# Patient Record
Sex: Male | Born: 1990 | Race: Black or African American | Hispanic: No | Marital: Single | Smoking: Never smoker
Health system: Southern US, Community
[De-identification: ages and names within clinical notes are randomized; demographics above are authoritative.]

## PROBLEM LIST (undated history)

## (undated) DIAGNOSIS — R569 Unspecified convulsions: Secondary | ICD-10-CM

## (undated) HISTORY — DX: Unspecified convulsions: R56.9

---
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
## 2012-03-31 ENCOUNTER — Ambulatory Visit: Payer: BC Managed Care – PPO

## 2012-03-31 ENCOUNTER — Telehealth: Payer: Self-pay | Admitting: Internal Medicine

## 2012-03-31 DIAGNOSIS — Z2839 Other underimmunization status: Secondary | ICD-10-CM | POA: Insufficient documentation

## 2012-03-31 DIAGNOSIS — Z283 Underimmunization status: Secondary | ICD-10-CM | POA: Insufficient documentation

## 2012-03-31 DIAGNOSIS — A64 Unspecified sexually transmitted disease: Secondary | ICD-10-CM

## 2012-03-31 DIAGNOSIS — Z0000 Encounter for general adult medical examination without abnormal findings: Secondary | ICD-10-CM

## 2012-05-19 ENCOUNTER — Ambulatory Visit: Payer: BC Managed Care – PPO | Admitting: Internal Medicine

## 2012-05-20 ENCOUNTER — Encounter: Payer: Self-pay | Admitting: Internal Medicine

## 2012-05-20 ENCOUNTER — Ambulatory Visit (INDEPENDENT_AMBULATORY_CARE_PROVIDER_SITE_OTHER): Payer: BC Managed Care – PPO | Admitting: Internal Medicine

## 2012-05-20 VITALS — BP 112/70 | HR 56 | Temp 97.0°F | Ht 76.0 in | Wt 182.2 lb

## 2012-05-20 DIAGNOSIS — Z0000 Encounter for general adult medical examination without abnormal findings: Secondary | ICD-10-CM

## 2012-05-20 DIAGNOSIS — Z23 Encounter for immunization: Secondary | ICD-10-CM

## 2012-05-20 DIAGNOSIS — Z283 Underimmunization status: Secondary | ICD-10-CM

## 2012-05-20 NOTE — Assessment & Plan Note (Signed)
Due for MMR and meningococcal today - to be done, note given

## 2012-05-20 NOTE — Patient Instructions (Addendum)
You had the MMR and Meningococcal shots today, and the note for this given today Please continue your efforts at being more active, low cholesterol diet, and weight control. Daisey Must with your year at [employer]!

## 2012-05-21 ENCOUNTER — Encounter: Payer: Self-pay | Admitting: Internal Medicine

## 2012-05-21 NOTE — Progress Notes (Signed)
Subjective:    Patient ID: Andrew Barry, male    DOB: 04-29-1991, 21 y.o.   MRN: [id number]  HPI  Here for wellness and f/u with mother;  Overall doing ok;  Pt denies CP, worsening SOB, DOE, wheezing, orthopnea, PND, worsening LE edema, palpitations, dizziness or syncope.  Pt denies neurological change such as new Headache, facial or extremity weakness.  Pt denies polydipsia, polyuria, or low sugar symptoms. Pt states overall good compliance with treatment and medications, good tolerability, and trying to follow lower cholesterol diet.  Pt denies worsening depressive symptoms, suicidal ideation or panic. No fever, wt loss, night sweats, loss of appetite, or other constitutional symptoms.  Pt states good ability with ADL's, low fall risk, home safety reviewed and adequate, no significant changes in hearing or vision, and occasionally active with exercise.  Pt denies fever, wt loss, night sweats, loss of appetite, or other constitutional symptoms  Might participate in intramural sports in college - for first year [employer] start tomorrow.  Needs MMR and meningococcal immunizations today Past Medical History  Diagnosis Date  . 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  reports that he has never smoked. He does not have any smokeless tobacco history on file. He reports that he does not drink alcohol or use illicit drugs. family history includes Dementia in his maternal grandmother and Stroke in his maternal grandmother. No Known Allergies No current outpatient prescriptions on file prior to visit.   Current Facility-Administered Medications on File Prior to Visit  Medication Dose Route Frequency Provider Last Rate Last Dose  . TDaP (BOOSTRIX) injection 0.5 mL  0.5 mL Intramuscular Once Corwin Levins, MD       Review of Systems Review of Systems  Constitutional: Negative for diaphoresis, activity change, appetite  change and unexpected weight change.  HENT: Negative for hearing loss, ear pain, facial swelling, mouth sores and neck stiffness.   Eyes: Negative for pain, redness and visual disturbance.  Respiratory: Negative for shortness of breath and wheezing.   Cardiovascular: Negative for chest pain and palpitations.  Gastrointestinal: Negative for diarrhea, blood in stool, abdominal distention and rectal pain.  Genitourinary: Negative for hematuria, flank pain and decreased urine volume.  Musculoskeletal: Negative for myalgias and joint swelling.  Skin: Negative for color change and wound.  Neurological: Negative for syncope and numbness.  Hematological: Negative for adenopathy.  Psychiatric/Behavioral: Negative for hallucinations, self-injury, decreased concentration and agitation.      Objective:   Physical Exam BP 112/70  Pulse 56  Temp 97 F (36.1 C) (Oral)  Ht 6\' 4"  (1.93 m)  Wt 182 lb 4 oz (82.668 kg)  BMI 22.18 kg/m2  SpO2 98% Physical Exam  VS noted Constitutional: Pt is oriented to person, place, and time. Appears well-developed and well-nourished.  HENT:  Head: Normocephalic and atraumatic.  Right Ear: External ear normal.  Left Ear: External ear normal.  Nose: Nose normal.  Mouth/Throat: Oropharynx is clear and moist.  Eyes: Conjunctivae and EOM are normal. Pupils are equal, round, and reactive to light.  Neck: Normal range of motion. Neck supple. No JVD present. No tracheal deviation present.  Cardiovascular: Normal rate, regular rhythm, normal heart sounds and intact distal pulses.   Pulmonary/Chest: Effort normal and breath sounds normal.  Abdominal: Soft. Bowel sounds are normal. There is no tenderness.  Musculoskeletal: Normal range of motion. Exhibits no edema.  Lymphadenopathy:  Has no cervical adenopathy.  Neurological: Pt is alert and oriented to person, place, and time. Pt has normal reflexes. No cranial nerve deficit.  Skin: Skin is warm and dry. No rash noted.    Psychiatric:  Has  normal mood and affect. Behavior is normal.     Assessment & Plan:

## 2012-05-21 NOTE — Assessment & Plan Note (Signed)

## 2012-08-24 ENCOUNTER — Ambulatory Visit: Payer: Self-pay | Admitting: Internal Medicine

## 2012-08-24 DIAGNOSIS — Z0289 Encounter for other administrative examinations: Secondary | ICD-10-CM

## 2013-05-28 ENCOUNTER — Emergency Department (HOSPITAL_COMMUNITY): Payer: BC Managed Care – PPO

## 2013-05-28 ENCOUNTER — Emergency Department (HOSPITAL_COMMUNITY)
Admission: EM | Admit: 2013-05-28 | Discharge: 2013-05-28 | Disposition: A | Discharge status: Home / Self Care | Payer: BC Managed Care – PPO | Attending: Emergency Medicine | Admitting: Emergency Medicine

## 2013-05-28 ENCOUNTER — Encounter (HOSPITAL_COMMUNITY): Payer: Self-pay | Admitting: Emergency Medicine

## 2013-05-28 DIAGNOSIS — S8252XA Displaced fracture of medial malleolus of left tibia, initial encounter for closed fracture: Secondary | ICD-10-CM

## 2013-05-28 DIAGNOSIS — X500XXA Overexertion from strenuous movement or load, initial encounter: Secondary | ICD-10-CM | POA: Insufficient documentation

## 2013-05-28 DIAGNOSIS — R609 Edema, unspecified: Secondary | ICD-10-CM | POA: Insufficient documentation

## 2013-05-28 DIAGNOSIS — S8253XA Displaced fracture of medial malleolus of unspecified tibia, initial encounter for closed fracture: Secondary | ICD-10-CM | POA: Insufficient documentation

## 2013-05-28 DIAGNOSIS — Y9367 Activity, basketball: Secondary | ICD-10-CM | POA: Insufficient documentation

## 2013-05-28 DIAGNOSIS — Y929 Unspecified place or not applicable: Secondary | ICD-10-CM | POA: Insufficient documentation

## 2013-05-28 MED ORDER — HYDROCODONE-ACETAMINOPHEN 5-325 MG PO TABS: 2.0000 | ORAL_TABLET | Freq: Four times a day (QID) | ORAL | Status: AC | PRN

## 2013-05-28 MED ORDER — HYDROCODONE-ACETAMINOPHEN 5-325 MG PO TABS
2.0000 | ORAL_TABLET | Freq: Once | ORAL | Status: AC
  Administered 2013-05-28: 2 via ORAL
  Filled 2013-05-28: qty 2

## 2013-05-28 MED ORDER — PROMETHAZINE HCL 25 MG PO TABS: 25.0000 mg | ORAL_TABLET | Freq: Four times a day (QID) | ORAL | Status: AC | PRN

## 2013-05-28 MED ORDER — ONDANSETRON 8 MG PO TBDP
8.0000 mg | ORAL_TABLET | Freq: Once | ORAL | Status: AC
  Administered 2013-05-28: 8 mg via ORAL
  Filled 2013-05-28: qty 1

## 2013-05-28 NOTE — ED Provider Notes (Signed)
CSN: [id number]     Arrival date & time 05/28/13  1314 History     First MD Initiated Contact with Patient 05/28/13 1345     Chief Complaint  Patient presents with  . Ankle Injury   (Consider location/radiation/quality/duration/timing/severity/associated sxs/prior Treatment) HPI Comments: Patient is a 22 year old male presents today with Left ankle pain. He reports that he jumped up for a layup yesterday and came down on his left ankle. He had an eversion ankle injury at that time. Pain is sharp and now throbbing. Walking and palpation make the pain worse. He has been walking on this ankle since the time of the injury. He reports that he was going to ice and elevate his ankle last night, but did not because he fell asleep early. He has never injured this ankle in the past. No fever, chills, nausea, vomiting, abdominal pain, numbness, weakness, paresthesias.   The history is provided by the patient. No language interpreter was used.    Past Medical History  Diagnosis Date  . 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 Family History  Problem Relation Age of Onset  . Stroke Maternal Grandmother   . Dementia Maternal Grandmother    History  Substance Use Topics  . Smoking status: Never Smoker   . Smokeless tobacco: Not on file  . Alcohol Use: No    Review of Systems  Constitutional: Negative for fever and chills.  Respiratory: Negative for shortness of breath.   Gastrointestinal: Negative for nausea, vomiting and abdominal pain.  Musculoskeletal: Positive for joint swelling, arthralgias and gait problem.  All other systems reviewed and are negative.    Allergies  Review of patient's allergies indicates no known allergies.  Home Medications  No current outpatient prescriptions on file. BP 111/65  Pulse 61  Temp(Src) 97.6 F (36.4 C) (Oral)  Resp 18  Wt 195 lb (88.451 kg)  BMI  23.75 kg/m2  SpO2 100% Physical Exam  Nursing note and vitals reviewed. Constitutional: He is oriented to person, place, and time. He appears well-developed and well-nourished. No distress.  HENT:  Head: Normocephalic and atraumatic.  Right Ear: External ear normal.  Left Ear: External ear normal.  Nose: Nose normal.  Eyes: Conjunctivae are normal.  Neck: Normal range of motion. No tracheal deviation present.  Cardiovascular: Normal rate, regular rhythm and normal heart sounds.   Pulmonary/Chest: Effort normal and breath sounds normal. No stridor.  Abdominal: Soft. He exhibits no distension. There is no tenderness.  Musculoskeletal: Normal range of motion.  Neurovascularly intact. Compartment soft. Swelling surrounding the medial malleolus. Area is tender, but without bruising or erythema.  Neurological: He is alert and oriented to person, place, and time.  Skin: Skin is warm and dry. He is not diaphoretic.  Psychiatric: He has a normal mood and affect. His behavior is normal.    ED Course   Procedures (including critical care time)  Labs Reviewed - No data to display Dg Ankle Complete Left  05/28/2013   *RADIOLOGY REPORT*  Clinical Data: Pain after jumping injury.  LEFT ANKLE COMPLETE - 3+ VIEW  Comparison: None.  Findings: There is a small avulsion fracture from the tip of the medial malleolus, distracted less than a millimeter.  There is some adjacent soft tissue swelling.  Ankle mortise intact.  No other acute bony abnormality.  No significant osseous degenerative change.  Normal mineralization and alignment.  IMPRESSION:  Medial malleolus cortical avulsion fracture with associated soft tissue swelling.   Original Report Authenticated By: D. Andria Rhein, MD   1. Avulsion fracture of medial malleolus, left, closed, initial encounter     MDM  Patient with medial malleolus fracture. Neurovascularly intact. Compartment soft. He was given crutches and a Cam Walker. He was encouraged  to follow up with Ortho and remain nonweightbearing until he has done so. He was given a short course of pain medication. Rest, ice, elevate. Return instructions given. Vital signs stable for discharge. Patient / Family / Caregiver informed of clinical course, understand medical decision-making process, and agree with plan.   Mora Bellman, PA-C 05/28/13 [phone]

## 2013-05-28 NOTE — ED Notes (Signed)
Ortho tec at bedside 

## 2013-05-28 NOTE — ED Notes (Signed)
Pt states that he hurt his left ankle yesterday playing basketball.

## 2013-05-29 NOTE — ED Provider Notes (Signed)
Medical screening examination/treatment/procedure(s) were performed by non-physician practitioner and as supervising physician I was immediately available for consultation/collaboration.    Haidyn Chadderdon J. Farron Lafond, MD 05/29/13 0706 

## 2014-08-24 ENCOUNTER — Emergency Department (HOSPITAL_COMMUNITY)
Admission: EM | Admit: 2014-08-24 | Discharge: 2014-08-24 | Disposition: A | Discharge status: Home / Self Care | Payer: BC Managed Care – PPO | Attending: Emergency Medicine | Admitting: Emergency Medicine

## 2014-08-24 ENCOUNTER — Emergency Department (HOSPITAL_COMMUNITY): Payer: BC Managed Care – PPO

## 2014-08-24 ENCOUNTER — Encounter (HOSPITAL_COMMUNITY): Payer: Self-pay | Admitting: *Deleted

## 2014-08-24 DIAGNOSIS — Y998 Other external cause status: Secondary | ICD-10-CM | POA: Insufficient documentation

## 2014-08-24 DIAGNOSIS — X58XXXA Exposure to other specified factors, initial encounter: Secondary | ICD-10-CM | POA: Insufficient documentation

## 2014-08-24 DIAGNOSIS — Y9367 Activity, basketball: Secondary | ICD-10-CM | POA: Insufficient documentation

## 2014-08-24 DIAGNOSIS — S93401A Sprain of unspecified ligament of right ankle, initial encounter: Secondary | ICD-10-CM

## 2014-08-24 DIAGNOSIS — Y92310 Basketball court as the place of occurrence of the external cause: Secondary | ICD-10-CM | POA: Insufficient documentation

## 2014-08-24 MED ORDER — NAPROXEN 500 MG PO TABS: 500.0000 mg | ORAL_TABLET | Freq: Two times a day (BID) | ORAL | Status: AC

## 2014-08-24 MED ORDER — TRAMADOL HCL 50 MG PO TABS: 50.0000 mg | ORAL_TABLET | Freq: Four times a day (QID) | ORAL | Status: DC | PRN

## 2014-08-24 NOTE — ED Notes (Signed)
Patient states that he was playing basketball last night and rolled over on the right ankle and someone stepped on it as well. He states he has not been able to bear weight since the injury. Swelling noted on assessment.

## 2014-08-24 NOTE — ED Provider Notes (Signed)
CSN: [id number]     Arrival date & time 08/24/14  1150 History  This chart was scribed for Santiago GladHeather Rangel Echeverri, PA-C, working with Tilden FossaElizabeth Rees, MD found by Elon SpannerGarrett Cook, ED Scribe. This patient was seen in room WTR8/WTR8 and the patient's care was started at 12:55 PM.     Chief Complaint  Patient presents with  . Ankle Pain   The history is provided by the patient. No language interpreter was used.  HPI Comments: Andrew Barry is a 23 y.o. male who presents to the Emergency Department complaining of a twisting right ankle injury that occurred yesterday while playing basketball.  Pain worse along the medial malleolus.  He was unable to bear weight immediately after the incident.  He reports current pain and swelling in his right ankle.  Patient denies taking any medication for pain but did ice the affected area this morning without relief.  Patient denies numbness, tingling.  Past Medical History  Diagnosis Date  . 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 No pertinent past surgical history. Family History  Problem Relation Age of Onset  . Stroke Maternal Grandmother   . Dementia Maternal Grandmother    History  Substance Use Topics  . Smoking status: Never Smoker   . Smokeless tobacco: Not on file  . Alcohol Use: No    Review of Systems  Musculoskeletal: Positive for joint swelling and arthralgias.  Neurological: Negative for weakness and numbness.  All other systems reviewed and are negative.     Allergies  Review of patient's allergies indicates no known allergies.  Home Medications   Prior to Admission medications   Medication Sig Start Date End Date Taking? Authorizing Provider  HYDROcodone-acetaminophen (NORCO/VICODIN) 5-325 MG per tablet Take 2 tablets by mouth every 6 (six) hours as needed for pain. 05/28/13   Mora BellmanHannah S Merrell, PA-C  promethazine (PHENERGAN) 25 MG tablet Take 1 tablet (25 mg  total) by mouth every 6 (six) hours as needed for nausea. 05/28/13   Mora BellmanHannah S Merrell, PA-C   BP 113/65 mmHg  Pulse 78  Temp(Src) 98.5 F (36.9 C) (Oral)  SpO2 100% Physical Exam  Constitutional: He is oriented to person, place, and time. He appears well-developed and well-nourished. No distress.  HENT:  Head: Normocephalic and atraumatic.  Eyes: Conjunctivae and EOM are normal.  Neck: Neck supple. No tracheal deviation present.  Cardiovascular: Normal rate, regular rhythm and normal heart sounds.   Pulses:      Dorsalis pedis pulses are 2+ on the right side.  Pulmonary/Chest: Effort normal and breath sounds normal. No respiratory distress.  Musculoskeletal: Normal range of motion.  Diffuse swelling of medial and lateral malleolus.  TTP of medial malleolus.  Limited ROM of right ankle secondary to pain.  FROM right knee.   Neurological: He is alert and oriented to person, place, and time.  Distal sensation of right foot intact  Skin: Skin is warm and dry.  Psychiatric: He has a normal mood and affect. His behavior is normal.  Nursing note and vitals reviewed.   ED Course  Procedures (including critical care time)  DIAGNOSTIC STUDIES: Oxygen Saturation is 100% on RA, normal by my interpretation.    COORDINATION OF CARE:  1:00 PM Informed patient of suspicion of sprain.  Will supply patient with a brace and crutches.  Advised patient to not bear weight if it is painful.  Advised patient to follow-up with  PCP in 1 week.  Patient should ice/elevate right ankle and take ibuprofen for pain.  Patient acknowledges and agrees with plan.    Labs Review Labs Reviewed - No data to display  Imaging Review Dg Ankle Complete Right  08/24/2014   CLINICAL DATA:  Patient states that he was playing basketball last night and rolled over on the right ankle and someone stepped on it as well. He states he has not been able to bear weight since the injury. Swelling noted laterally.  EXAM: RIGHT  ANKLE - COMPLETE 3+ VIEW  COMPARISON:  None.  FINDINGS: Diffuse soft tissue swelling is present over the ankle. Ankle mortise is within normal. There is no acute fracture or dislocation.  IMPRESSION: No acute fracture.   Electronically Signed   By: Elberta Fortisaniel  Boyle M.D.   On: 08/24/2014 12:48     EKG Interpretation None      MDM   Final diagnoses:  None   Patient presenting with right ankle pain after twisting his ankle while playing basketball yesterday.  Xray negative. Patient neurovascularly intact.  Patient given Ankle ASO and crutches.  Stable for discharge.  Return precautions given.  Santiago GladHeather Dontavious Emily, PA-C 08/24/14 1326  Tilden FossaElizabeth Rees, MD 08/24/14 [phone]

## 2015-04-28 ENCOUNTER — Encounter (HOSPITAL_COMMUNITY): Payer: Self-pay

## 2015-04-28 ENCOUNTER — Emergency Department (HOSPITAL_COMMUNITY)
Admission: EM | Admit: 2015-04-28 | Discharge: 2015-04-28 | Disposition: A | Discharge status: Home / Self Care | Payer: BLUE CROSS/BLUE SHIELD | Attending: Emergency Medicine | Admitting: Emergency Medicine

## 2015-04-28 DIAGNOSIS — Z791 Long term (current) use of non-steroidal anti-inflammatories (NSAID): Secondary | ICD-10-CM | POA: Insufficient documentation

## 2015-04-28 DIAGNOSIS — M545 Low back pain, unspecified: Secondary | ICD-10-CM

## 2015-04-28 DIAGNOSIS — Z8669 Personal history of other diseases of the nervous system and sense organs: Secondary | ICD-10-CM | POA: Insufficient documentation

## 2015-04-28 MED ORDER — METHOCARBAMOL 500 MG PO TABS: 500.0000 mg | ORAL_TABLET | Freq: Two times a day (BID) | ORAL | Status: DC

## 2015-04-28 MED ORDER — IBUPROFEN 800 MG PO TABS: 800.0000 mg | ORAL_TABLET | Freq: Three times a day (TID) | ORAL | Status: AC

## 2015-04-28 NOTE — ED Notes (Signed)
Pt presents with c/o back pain. Pt reports that he might have injured his back playing basketball. Pt c/o pain in the lower region of his back, ambulatory to triage, reports pain is worse with sitting.

## 2015-04-28 NOTE — Discharge Instructions (Signed)
Heat Therapy Heat therapy can help ease sore, stiff, injured, and tight muscles and joints. Heat relaxes your muscles, which may help ease your pain.  RISKS AND COMPLICATIONS If you have any of the following conditions, do not use heat therapy unless your health care provider has approved:  Poor circulation.  Healing wounds or scarred skin in the area being treated.  Diabetes, heart disease, or high blood pressure.  Not being able to feel (numbness) the area being treated.  Unusual swelling of the area being treated.  Active infections.  Blood clots.  Cancer.  Inability to communicate pain. This may include young children and people who have problems with their brain function (dementia).  Pregnancy. Heat therapy should only be used on old, pre-existing, or long-lasting (chronic) injuries. Do not use heat therapy on new injuries unless directed by your health care provider. HOW TO USE HEAT THERAPY There are several different kinds of heat therapy, including:  Moist heat pack.  Warm water bath.  Hot water bottle.  Electric heating pad.  Heated gel pack.  Heated wrap.  Electric heating pad. Use the heat therapy method suggested by your health care provider. Follow your health care provider's instructions on when and how to use heat therapy. GENERAL HEAT THERAPY RECOMMENDATIONS  Do not sleep while using heat therapy. Only use heat therapy while you are awake.  Your skin may turn pink while using heat therapy. Do not use heat therapy if your skin turns red.  Do not use heat therapy if you have new pain.  High heat or long exposure to heat can cause burns. Be careful when using heat therapy to avoid burning your skin.  Do not use heat therapy on areas of your skin that are already irritated, such as with a rash or sunburn. SEEK MEDICAL CARE IF:  You have blisters, redness, swelling, or numbness.  You have new pain.  Your pain is worse. MAKE SURE  YOU:  Understand these instructions.  Will watch your condition.  Will get help right away if you are not doing well or get worse. Document Released: 12/14/2011 Document Revised: 02/05/2014 Document Reviewed: 11/14/2013 [facility] Patient Information 2015 [place], [place]. This information is not intended to replace advice given to you by your health care provider. Make sure you discuss any questions you have with your health care provider. Back Pain, Adult Low back pain is very common. About 1 in 5 people have back pain.The cause of low back pain is rarely dangerous. The pain often gets better over time.About half of people with a sudden onset of back pain feel better in just 2 weeks. About 8 in 10 people feel better by 6 weeks.  CAUSES Some common causes of back pain include:  Strain of the muscles or ligaments supporting the spine.  Wear and tear (degeneration) of the spinal discs.  Arthritis.  Direct injury to the back. DIAGNOSIS Most of the time, the direct cause of low back pain is not known.However, back pain can be treated effectively even when the exact cause of the pain is unknown.Answering your caregiver's questions about your overall health and symptoms is one of the most accurate ways to make sure the cause of your pain is not dangerous. If your caregiver needs more information, he or she may order lab work or imaging tests (X-rays or MRIs).However, even if imaging tests show changes in your back, this usually does not require surgery. HOME CARE INSTRUCTIONS For many people, back pain returns.Since low back  pain is rarely dangerous, it is often a condition that people can learn to manage on their own.  °· Remain active. It is stressful on the back to sit or stand in one place. Do not sit, drive, or stand in one place for more than 30 minutes at a time. Take short walks on level surfaces as soon as pain allows. Try to increase the length of time you walk each day. °· Do not  stay in bed. Resting more than 1 or 2 days can delay your recovery. °· Do not avoid exercise or work. Your body is made to move. It is not dangerous to be active, even though your back may hurt. Your back will likely heal faster if you return to being active before your pain is gone. °· Pay attention to your body when you  bend and lift. Many people have less discomfort when lifting if they bend their knees, keep the load close to their bodies, and avoid twisting. Often, the most comfortable positions are those that put less stress on your recovering back. °· Find a comfortable position to sleep. Use a firm mattress and lie on your side with your knees slightly bent. If you lie on your back, put a pillow under your knees. °· Only take over-the-counter or prescription medicines as directed by your caregiver. Over-the-counter medicines to reduce pain and inflammation are often the most helpful. Your caregiver may prescribe muscle relaxant drugs. These medicines help dull your pain so you can more quickly return to your normal activities and healthy exercise. °· Put ice on the injured area. °· Put ice in a plastic bag. °· Place a towel between your skin and the bag. °· Leave the ice on for 15-20 minutes, 03-04 times a day for the first 2 to 3 days. After that, ice and heat may be alternated to reduce pain and spasms. °· Ask your caregiver about trying back exercises and gentle massage. This may be of some benefit. °· Avoid feeling anxious or stressed. Stress increases muscle tension and can worsen back pain. It is important to recognize when you are anxious or stressed and learn ways to manage it. Exercise is a great option. °SEEK MEDICAL CARE IF: °· You have pain that is not relieved with rest or medicine. °· You have pain that does not improve in 1 week. °· You have new symptoms. °· You are generally not feeling well. °SEEK IMMEDIATE MEDICAL CARE IF:  °· You have pain that radiates from your back into your  legs. °· You develop new bowel or bladder control problems. °· You have unusual weakness or numbness in your arms or legs. °· You develop nausea or vomiting. °· You develop abdominal pain. °· You feel faint. °Document Released: 09/21/2005 Document Revised: 03/22/2012 Document Reviewed: 01/23/2014 °ExitCare® Patient Information ©2015 [employer] LLC. This information is not intended to replace advice given to you by your health care provider. Make sure you discuss any questions you have with your health care provider. ° °

## 2015-04-28 NOTE — ED Provider Notes (Signed)
CSN: [id number]     Arrival date & time 04/28/15  1847 History  This chart was scribed for non-physician practitioner, Elpidio Anis, PA-C, working with Purvis Sheffield, MD, by Ronney Lion, ED Scribe. This patient was seen in room WTR8/WTR8 and the patient's care was started at 8:35 PM.   Chief Complaint  Patient presents with  . Back Pain   The history is provided by the patient. No language interpreter was used.    HPI Comments: Andrew Barry is a 24 y.o. male who presents to the Emergency Department complaining of constant, moderate, gradually worsening, mid back pain. He suspects he injured his back when playing basketball 1 week ago; he states he just started playing again after a period of inactivity. He states he has difficulty leaning backwards as he usually can, secondary to the pain. He has taken about 10 tablets of Tylenol with only some relief. Patient has also tried applying heat and ice with no relief. He denies bowel or bladder incontinence, saddle anesthesia, numbness, tingling, or abdominal pain. He states he has no other complaints.   Past Medical History  Diagnosis Date  . 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 No pertinent past surgical history. Family History  Problem Relation Age of Onset  . Stroke Maternal Grandmother   . Dementia Maternal Grandmother    History  Substance Use Topics  . Smoking status: Never Smoker   . Smokeless tobacco: Not on file  . Alcohol Use: No    Review of Systems  Gastrointestinal: Negative for abdominal pain.  Musculoskeletal: Positive for back pain.  Neurological: Negative for numbness.    Allergies  Review of patient's allergies indicates no known allergies.  Home Medications   Prior to Admission medications   Medication Sig Start Date End Date Taking? Authorizing Provider  HYDROcodone-acetaminophen (NORCO/VICODIN) 5-325 MG per tablet Take 2 tablets  by mouth every 6 (six) hours as needed for pain. 05/28/13   Junious Silk, PA-C  naproxen (NAPROSYN) 500 MG tablet Take 1 tablet (500 mg total) by mouth 2 (two) times daily. 08/24/14   Heather Laisure, PA-C  promethazine (PHENERGAN) 25 MG tablet Take 1 tablet (25 mg total) by mouth every 6 (six) hours as needed for nausea. 05/28/13   Junious Silk, PA-C  traMADol (ULTRAM) 50 MG tablet Take 1 tablet (50 mg total) by mouth every 6 (six) hours as needed. 08/24/14   Heather Laisure, PA-C   BP 131/66 mmHg  Pulse 70  Temp(Src) 98.1 F (36.7 C) (Oral)  Resp 20  SpO2 100% Physical Exam  Constitutional: He is oriented to person, place, and time. He appears well-developed and well-nourished. No distress.  HENT:  Head: Normocephalic and atraumatic.  Eyes: Conjunctivae and EOM are normal.  Neck: Neck supple. No tracheal deviation present.  Cardiovascular: Normal rate and intact distal pulses.   Pulmonary/Chest: Effort normal. No respiratory distress.  Musculoskeletal: He exhibits tenderness.  Decreased ROM in flexion and extension. Pain elicited with right lateral bending. Tenderness in the midline along the lumbar region.   Neurological: He is alert and oriented to person, place, and time.  Distal light sensation intact bilaterally.   Skin: Skin is warm and dry.  Psychiatric: He has a normal mood and affect. His behavior is normal.  Nursing note and vitals reviewed.   ED Course  Procedures (including critical care time)  DIAGNOSTIC STUDIES: Oxygen Saturation is 100% on RA, normal  by my interpretation.    COORDINATION OF CARE: 8:40 PM - Suspect muscle strain. Will Rx muscle relaxants, and advise heating pad application.   MDM   Final diagnoses:  None   1. Low back pain  Suspect muscular strain injury requiring supportive treatment.  Marland Kitchenscrob       Elpidio Anis, PA-C 04/28/15 2104  Purvis Sheffield, MD 04/30/15 [phone]

## 2015-05-02 ENCOUNTER — Encounter: Payer: Self-pay | Admitting: Internal Medicine

## 2015-05-02 ENCOUNTER — Ambulatory Visit (INDEPENDENT_AMBULATORY_CARE_PROVIDER_SITE_OTHER): Payer: BLUE CROSS/BLUE SHIELD | Admitting: Internal Medicine

## 2015-05-02 VITALS — BP 106/66 | HR 84 | Temp 98.3°F | Ht 75.0 in | Wt 177.0 lb

## 2015-05-02 DIAGNOSIS — M549 Dorsalgia, unspecified: Secondary | ICD-10-CM

## 2015-05-02 MED ORDER — TRAMADOL HCL 50 MG PO TABS: 50.0000 mg | ORAL_TABLET | Freq: Four times a day (QID) | ORAL | Status: AC | PRN

## 2015-05-02 MED ORDER — METHOCARBAMOL 500 MG PO TABS
500.0000 mg | ORAL_TABLET | Freq: Two times a day (BID) | ORAL | Status: AC
Start: 2015-05-02 — End: ?

## 2015-05-02 NOTE — Progress Notes (Signed)
Pre visit review using our clinic review tool, if applicable. No additional management support is needed unless otherwise documented below in the visit note. 

## 2015-05-02 NOTE — Assessment & Plan Note (Signed)
C/w msk strain, for pain control, muscle relaxer prn, ok for work note for today and tomorrow,  to f/u any worsening symptoms or concerns

## 2015-05-02 NOTE — Patient Instructions (Signed)
Please take all new medication as prescribed  Please continue all other medications as before, and refills have been done if requested.  Please have the pharmacy call with any other refills you may need.  Please keep your appointments with your specialists as you may have planned  You are given the work note as well. 

## 2015-05-02 NOTE — Progress Notes (Signed)
   Subjective:    Patient ID: Andrew Barry, male    DOB: Apr 09, 1991, 24 y.o.   MRN: [id number]  HPI  Here with acute onset bilat lower back pain, without bowel or bladder change, fever, wt loss,  worsening LE pain/numbness/weakness, gait change or falls.  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 Pt denies chest pain, increased sob or doe, wheezing, orthopnea, PND, increased LE swelling, palpitations, dizziness or syncope.  Pt denies new neurological symptoms such as new headache, or facial or extremity weakness or numbness Past Medical History  Diagnosis Date  . 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  reports that he has never smoked. He does not have any smokeless tobacco history on file. He reports that he does not drink alcohol or use illicit drugs. family history includes Dementia in his maternal grandmother; Stroke in his maternal grandmother. No Known Allergies Current Outpatient Prescriptions on File Prior to Visit  Medication Sig Dispense Refill  . ibuprofen (ADVIL,MOTRIN) 800 MG tablet Take 1 tablet (800 mg total) by mouth 3 (three) times daily. 21 tablet 0  . HYDROcodone-acetaminophen (NORCO/VICODIN) 5-325 MG per tablet Take 2 tablets by mouth every 6 (six) hours as needed for pain. (Patient not taking: Reported on 05/02/2015) 12 tablet 0  . naproxen (NAPROSYN) 500 MG tablet Take 1 tablet (500 mg total) by mouth 2 (two) times daily. (Patient not taking: Reported on 05/02/2015) 30 tablet 0  . promethazine (PHENERGAN) 25 MG tablet Take 1 tablet (25 mg total) by mouth every 6 (six) hours as needed for nausea. (Patient not taking: Reported on 05/02/2015) 12 tablet 0   Current Facility-Administered Medications on File Prior to Visit  Medication Dose Route Frequency Provider Last Rate Last Dose  . TDaP (BOOSTRIX) injection 0.5 mL  0.5 mL Intramuscular Once Corwin Levins, MD       Review of Systems All otherwise neg per pt     Objective:   Physical Exam BP 106/66 mmHg  Pulse 84  Temp(Src) 98.3 F (36.8 C) (Oral)  Ht  (1.905 m)  Wt 177 lb (80.287 kg)  BMI 22.12 kg/m2  SpO2 96% VS noted,  Constitutional: Pt appears in no significant distress HENT: Head: NCAT.  Right Ear: External ear normal.  Left Ear: External ear normal.  Eyes: . Pupils are equal, round, and reactive to light. Conjunctivae and EOM are normal Neck: Normal range of motion. Neck supple.  Cardiovascular: Normal rate and regular rhythm.   Pulmonary/Chest: Effort normal and breath sounds without rales or wheezing.  Abd:  Soft, NT, ND, + BS Neurological: Pt is alert. Not confused , motor grossly intact Skin: Skin is warm. No rash, no LE edema Psychiatric: Pt behavior is normal. No agitation.  Lumbar spine nontender + bilat lumbar paravertebral muscular tender/spasm     Assessment & Plan:

## 2015-05-06 ENCOUNTER — Telehealth: Payer: Self-pay | Admitting: Internal Medicine

## 2015-05-06 NOTE — Telephone Encounter (Signed)
After reviewing dr Raphael Gibney office visit note, i have typed letter to be excused from work and given to patient in office

## 2015-05-06 NOTE — Telephone Encounter (Signed)
Patient seen Dr. Jonny Ruiz last week and he told patient if he was unable to go back to work on Monday, to just come by a get a work excuse.  Please advise

## 2016-01-12 IMAGING — CR DG ANKLE COMPLETE 3+V*R*
3 series · 3 of 3 positions shown · non-contrast
Comparison: None.

CLINICAL DATA: Patient states that he was playing basketball last
night and rolled over on the right ankle and someone stepped on it
as well. He states he has not been able to bear weight since the
injury. Swelling noted laterally.

EXAM:
RIGHT ANKLE - COMPLETE 3+ VIEW

[x ankle ap right]
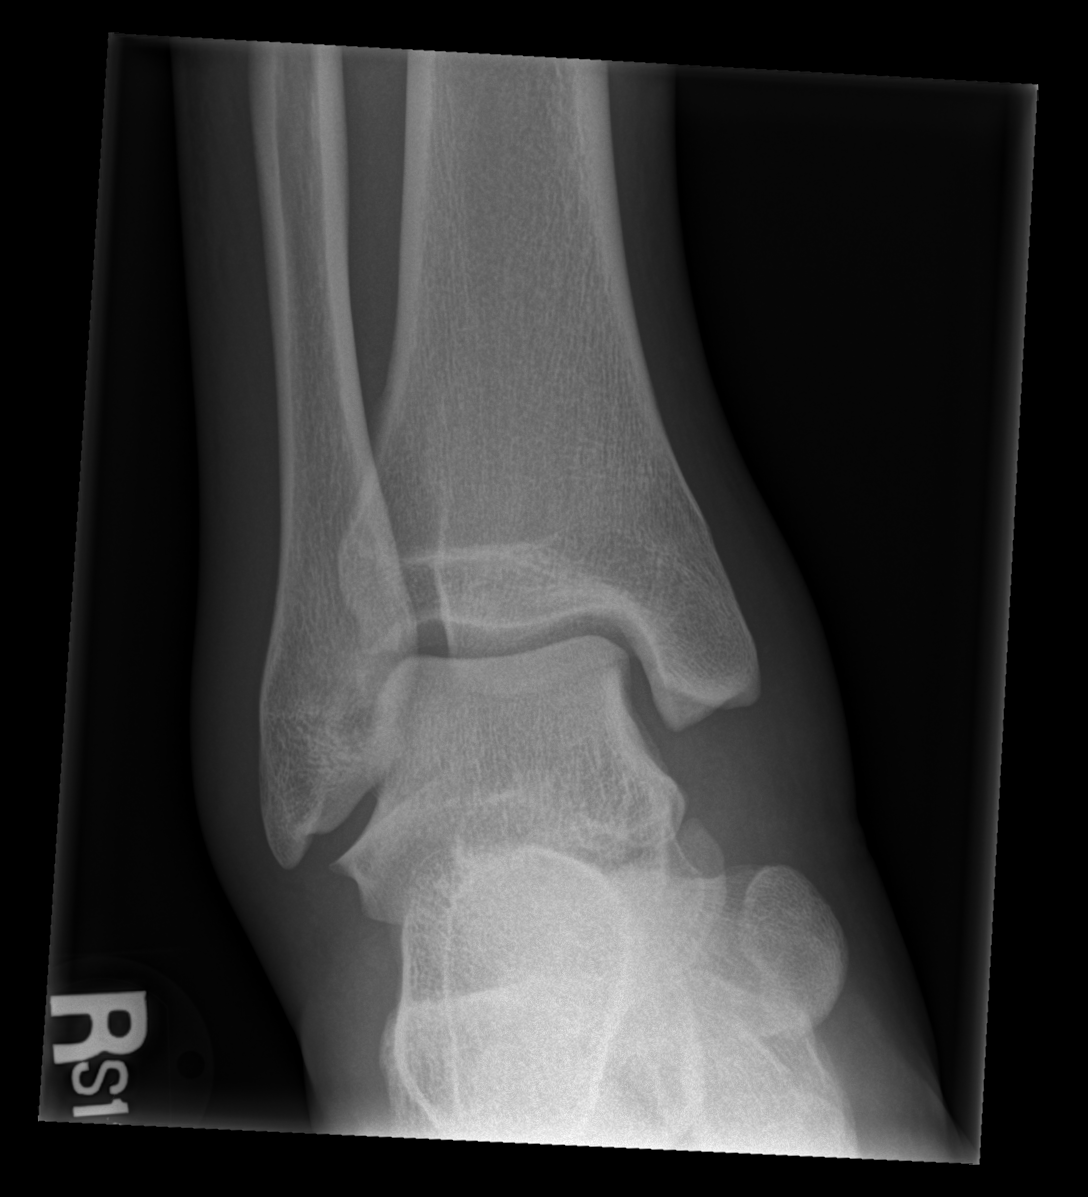

[x ankle obl right]
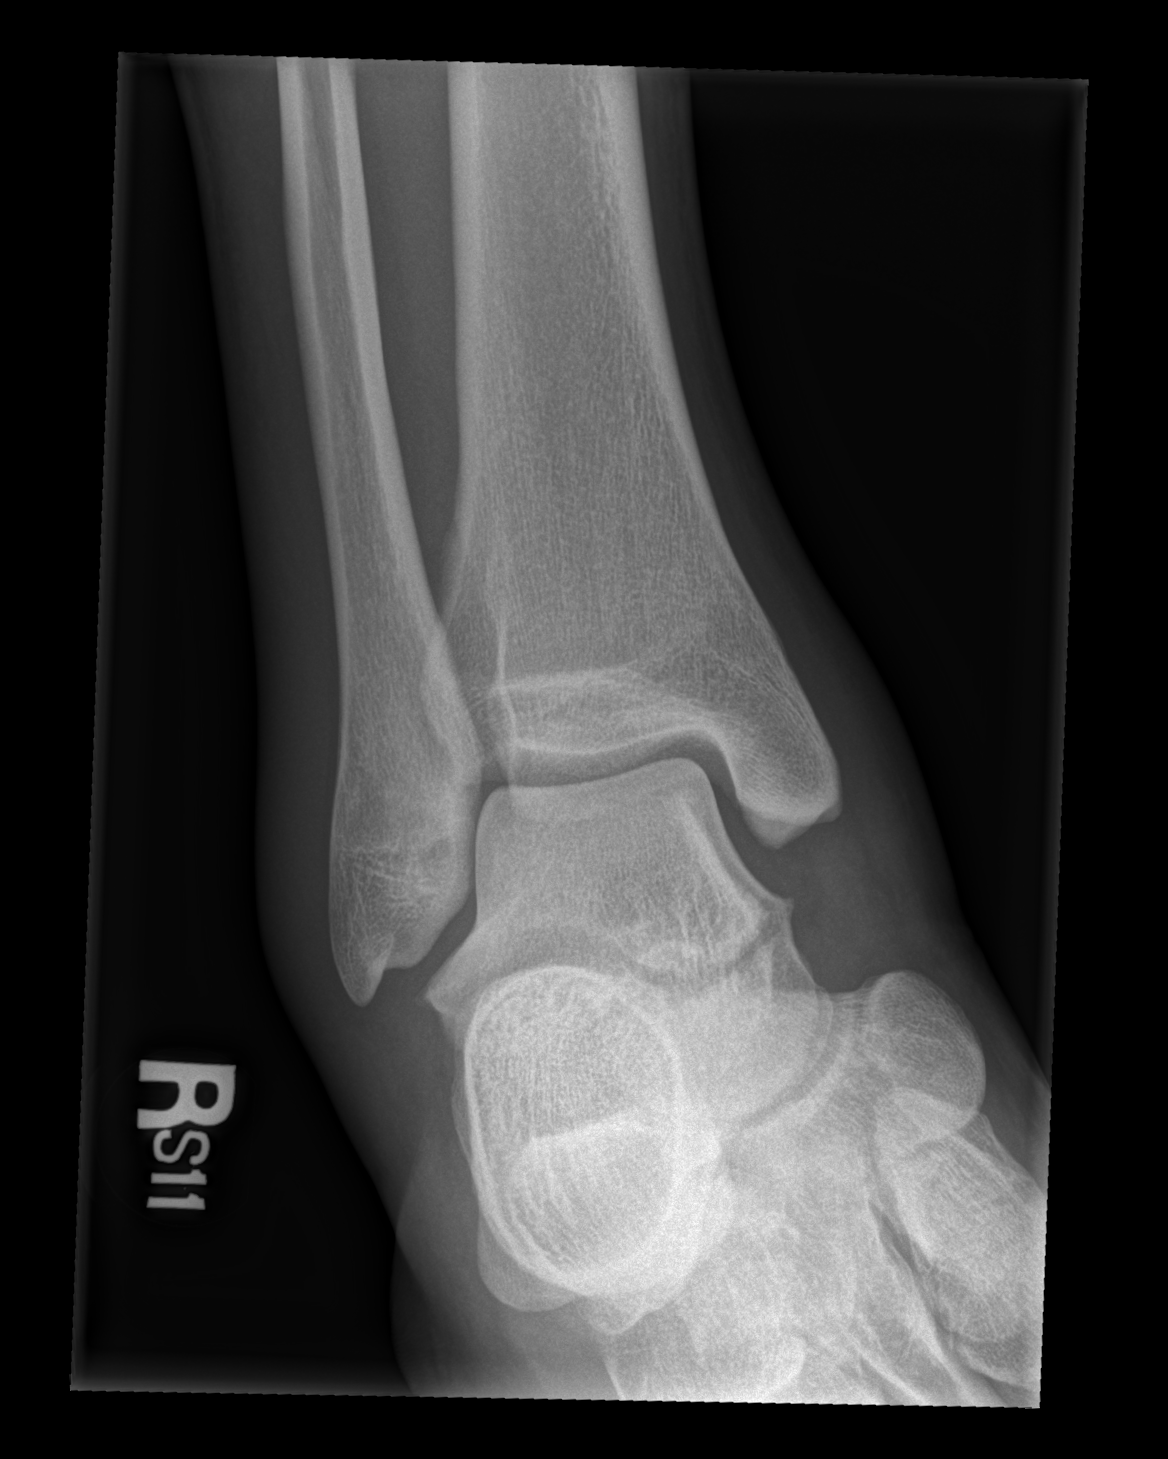

[x ankle lat right]
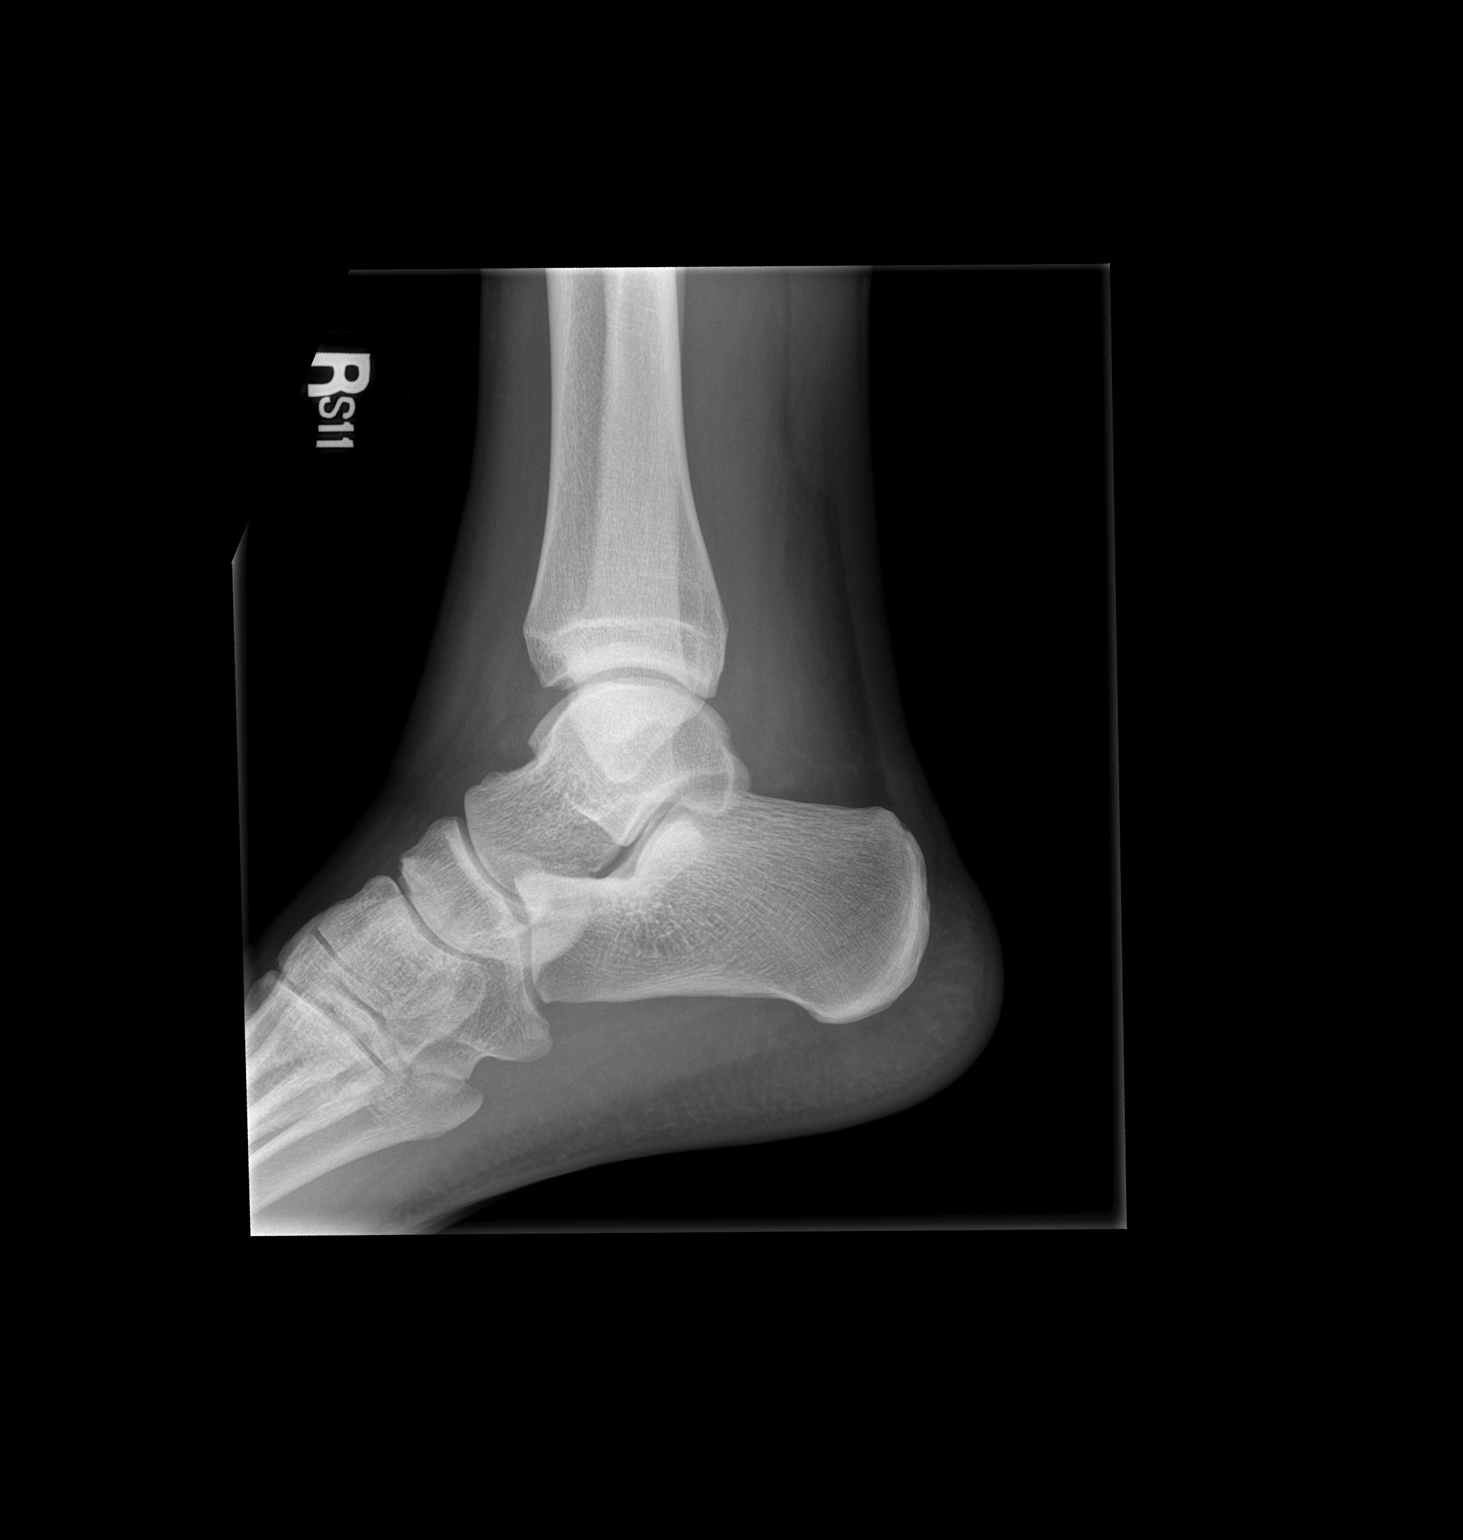

[3 of 3 positions shown; findings below may reference images not displayed]

FINDINGS: Diffuse soft tissue swelling is present over the ankle. Ankle
mortise is within normal. There is no acute fracture or dislocation.
IMPRESSION: No acute fracture.
